# Patient Record
Sex: Female | Born: 1990 | Race: White | Hispanic: No | Marital: Married | Smoking: Never smoker
Health system: Southern US, Community
[De-identification: ages and names within clinical notes are randomized; demographics above are authoritative.]

## PROBLEM LIST (undated history)

## (undated) DIAGNOSIS — Z8742 Personal history of other diseases of the female genital tract: Secondary | ICD-10-CM

## (undated) DIAGNOSIS — D6800 Von Willebrand disease, unspecified: Secondary | ICD-10-CM

## (undated) DIAGNOSIS — E559 Vitamin D deficiency, unspecified: Secondary | ICD-10-CM

## (undated) DIAGNOSIS — D649 Anemia, unspecified: Secondary | ICD-10-CM

## (undated) DIAGNOSIS — K589 Irritable bowel syndrome without diarrhea: Secondary | ICD-10-CM

## (undated) HISTORY — DX: Personal history of other diseases of the female genital tract: Z87.42

## (undated) HISTORY — PX: WISDOM TOOTH EXTRACTION: SHX21

## (undated) HISTORY — DX: Von Willebrand's disease: D68.0

## (undated) HISTORY — DX: Vitamin D deficiency, unspecified: E55.9

## (undated) HISTORY — DX: Von Willebrand disease, unspecified: D68.00

## (undated) HISTORY — DX: Anemia, unspecified: D64.9

## (undated) HISTORY — DX: Irritable bowel syndrome without diarrhea: K58.9

## (undated) HISTORY — PX: OTHER SURGICAL HISTORY: SHX169

---

## 2016-01-04 HISTORY — PX: OTHER SURGICAL HISTORY: SHX169

## 2016-09-19 DIAGNOSIS — Z3482 Encounter for supervision of other normal pregnancy, second trimester: Secondary | ICD-10-CM | POA: Diagnosis not present

## 2016-09-19 DIAGNOSIS — Z3483 Encounter for supervision of other normal pregnancy, third trimester: Secondary | ICD-10-CM | POA: Diagnosis not present

## 2016-09-19 DIAGNOSIS — O99113 Other diseases of the blood and blood-forming organs and certain disorders involving the immune mechanism complicating pregnancy, third trimester: Secondary | ICD-10-CM | POA: Diagnosis not present

## 2016-09-19 DIAGNOSIS — O99013 Anemia complicating pregnancy, third trimester: Secondary | ICD-10-CM | POA: Diagnosis not present

## 2016-09-19 DIAGNOSIS — O09893 Supervision of other high risk pregnancies, third trimester: Secondary | ICD-10-CM | POA: Diagnosis not present

## 2016-09-19 DIAGNOSIS — O0930 Supervision of pregnancy with insufficient antenatal care, unspecified trimester: Secondary | ICD-10-CM | POA: Diagnosis not present

## 2016-09-19 DIAGNOSIS — D509 Iron deficiency anemia, unspecified: Secondary | ICD-10-CM | POA: Diagnosis not present

## 2016-09-19 DIAGNOSIS — Z3689 Encounter for other specified antenatal screening: Secondary | ICD-10-CM | POA: Diagnosis not present

## 2016-09-19 DIAGNOSIS — Z3A36 36 weeks gestation of pregnancy: Secondary | ICD-10-CM | POA: Diagnosis not present

## 2016-09-19 DIAGNOSIS — Z841 Family history of disorders of kidney and ureter: Secondary | ICD-10-CM | POA: Diagnosis not present

## 2016-09-19 DIAGNOSIS — D680 Von Willebrand's disease: Secondary | ICD-10-CM | POA: Diagnosis not present

## 2016-09-26 DIAGNOSIS — O99013 Anemia complicating pregnancy, third trimester: Secondary | ICD-10-CM | POA: Diagnosis not present

## 2016-09-26 DIAGNOSIS — O99113 Other diseases of the blood and blood-forming organs and certain disorders involving the immune mechanism complicating pregnancy, third trimester: Secondary | ICD-10-CM | POA: Diagnosis not present

## 2016-09-26 DIAGNOSIS — O4703 False labor before 37 completed weeks of gestation, third trimester: Secondary | ICD-10-CM | POA: Diagnosis not present

## 2016-09-26 DIAGNOSIS — D680 Von Willebrand's disease: Secondary | ICD-10-CM | POA: Diagnosis not present

## 2016-09-26 DIAGNOSIS — Z3A36 36 weeks gestation of pregnancy: Secondary | ICD-10-CM | POA: Diagnosis not present

## 2016-09-26 DIAGNOSIS — Z862 Personal history of diseases of the blood and blood-forming organs and certain disorders involving the immune mechanism: Secondary | ICD-10-CM | POA: Diagnosis not present

## 2016-09-30 DIAGNOSIS — D680 Von Willebrand's disease: Secondary | ICD-10-CM | POA: Diagnosis not present

## 2016-09-30 DIAGNOSIS — O26893 Other specified pregnancy related conditions, third trimester: Secondary | ICD-10-CM | POA: Diagnosis not present

## 2016-09-30 DIAGNOSIS — O99113 Other diseases of the blood and blood-forming organs and certain disorders involving the immune mechanism complicating pregnancy, third trimester: Secondary | ICD-10-CM | POA: Diagnosis not present

## 2016-09-30 DIAGNOSIS — Z0371 Encounter for suspected problem with amniotic cavity and membrane ruled out: Secondary | ICD-10-CM | POA: Diagnosis not present

## 2016-09-30 DIAGNOSIS — O4292 Full-term premature rupture of membranes, unspecified as to length of time between rupture and onset of labor: Secondary | ICD-10-CM | POA: Diagnosis not present

## 2016-09-30 DIAGNOSIS — Z3A38 38 weeks gestation of pregnancy: Secondary | ICD-10-CM | POA: Diagnosis not present

## 2016-09-30 DIAGNOSIS — Z862 Personal history of diseases of the blood and blood-forming organs and certain disorders involving the immune mechanism: Secondary | ICD-10-CM | POA: Diagnosis not present

## 2016-10-03 DIAGNOSIS — Z8279 Family history of other congenital malformations, deformations and chromosomal abnormalities: Secondary | ICD-10-CM | POA: Diagnosis not present

## 2016-10-03 DIAGNOSIS — Z3A38 38 weeks gestation of pregnancy: Secondary | ICD-10-CM | POA: Diagnosis not present

## 2016-10-03 DIAGNOSIS — O368130 Decreased fetal movements, third trimester, not applicable or unspecified: Secondary | ICD-10-CM | POA: Diagnosis not present

## 2016-10-03 DIAGNOSIS — O99113 Other diseases of the blood and blood-forming organs and certain disorders involving the immune mechanism complicating pregnancy, third trimester: Secondary | ICD-10-CM | POA: Diagnosis not present

## 2016-10-03 DIAGNOSIS — D680 Von Willebrand's disease: Secondary | ICD-10-CM | POA: Diagnosis not present

## 2016-10-09 DIAGNOSIS — D680 Von Willebrand's disease: Secondary | ICD-10-CM | POA: Diagnosis not present

## 2016-10-09 DIAGNOSIS — O9912 Other diseases of the blood and blood-forming organs and certain disorders involving the immune mechanism complicating childbirth: Secondary | ICD-10-CM | POA: Diagnosis not present

## 2016-10-09 DIAGNOSIS — O9962 Diseases of the digestive system complicating childbirth: Secondary | ICD-10-CM | POA: Diagnosis not present

## 2016-10-09 DIAGNOSIS — Z3A39 39 weeks gestation of pregnancy: Secondary | ICD-10-CM | POA: Diagnosis not present

## 2016-10-09 DIAGNOSIS — D509 Iron deficiency anemia, unspecified: Secondary | ICD-10-CM | POA: Diagnosis not present

## 2016-10-09 DIAGNOSIS — Z791 Long term (current) use of non-steroidal anti-inflammatories (NSAID): Secondary | ICD-10-CM | POA: Diagnosis not present

## 2016-10-09 DIAGNOSIS — O9902 Anemia complicating childbirth: Secondary | ICD-10-CM | POA: Diagnosis not present

## 2016-10-09 DIAGNOSIS — Z302 Encounter for sterilization: Secondary | ICD-10-CM | POA: Diagnosis not present

## 2016-10-09 DIAGNOSIS — Z886 Allergy status to analgesic agent status: Secondary | ICD-10-CM | POA: Diagnosis not present

## 2016-10-09 DIAGNOSIS — Z79899 Other long term (current) drug therapy: Secondary | ICD-10-CM | POA: Diagnosis not present

## 2016-10-09 DIAGNOSIS — K580 Irritable bowel syndrome with diarrhea: Secondary | ICD-10-CM | POA: Diagnosis not present

## 2016-11-22 DIAGNOSIS — O9089 Other complications of the puerperium, not elsewhere classified: Secondary | ICD-10-CM | POA: Diagnosis not present

## 2016-11-22 DIAGNOSIS — R1031 Right lower quadrant pain: Secondary | ICD-10-CM | POA: Diagnosis not present

## 2016-12-16 DIAGNOSIS — N921 Excessive and frequent menstruation with irregular cycle: Secondary | ICD-10-CM | POA: Diagnosis not present

## 2016-12-16 DIAGNOSIS — N939 Abnormal uterine and vaginal bleeding, unspecified: Secondary | ICD-10-CM | POA: Diagnosis not present

## 2017-12-22 DIAGNOSIS — J111 Influenza due to unidentified influenza virus with other respiratory manifestations: Secondary | ICD-10-CM | POA: Diagnosis not present

## 2018-05-22 DIAGNOSIS — Z20828 Contact with and (suspected) exposure to other viral communicable diseases: Secondary | ICD-10-CM | POA: Diagnosis not present

## 2018-05-22 DIAGNOSIS — R6883 Chills (without fever): Secondary | ICD-10-CM | POA: Diagnosis not present

## 2018-05-22 DIAGNOSIS — J029 Acute pharyngitis, unspecified: Secondary | ICD-10-CM | POA: Diagnosis not present

## 2018-07-26 ENCOUNTER — Other Ambulatory Visit: Payer: Self-pay

## 2018-07-26 ENCOUNTER — Ambulatory Visit (INDEPENDENT_AMBULATORY_CARE_PROVIDER_SITE_OTHER): Payer: Self-pay | Admitting: Family Medicine

## 2018-07-26 DIAGNOSIS — N946 Dysmenorrhea, unspecified: Secondary | ICD-10-CM

## 2018-07-26 DIAGNOSIS — Z8719 Personal history of other diseases of the digestive system: Secondary | ICD-10-CM

## 2018-07-26 DIAGNOSIS — R110 Nausea: Secondary | ICD-10-CM

## 2018-07-26 MED ORDER — ONDANSETRON HCL 4 MG PO TABS: 4.0000 mg | ORAL_TABLET | Freq: Three times a day (TID) | ORAL | 0 refills | Status: DC | PRN

## 2018-07-26 NOTE — Progress Notes (Signed)
Virtual Visit via Telephone Note  I connected with Jordan Santos on 07/26/18 at  4:00 PM EDT by telephone and verified that I am speaking with the correct person using two identifiers.   I discussed the limitations, risks, security and privacy concerns of performing an evaluation and management service by telephone and the availability of in person appointments. I also discussed with the patient that there may be a patient responsible charge related to this service. The patient expressed understanding and agreed to proceed.  Location patient: home Location provider: work or home office Participants present for the call: patient, provider Patient did not have a visit in the prior 7 days to address this/these issue(s).   History of Present Illness:  Acute visit for Nausea: -she has dysmenorrhea, had ablation for DUB/heavy bleeding and tubes tied for this -she has nausea and cramping around the time of her periods -FDLMP: 2.5 weeks, has had bad cramping, nausea and joint pains with  periods - this period had the same, but still nauseous even though the period has resolved -she called her ob - they could not get her in until September -no vomiting or diarrhea -has IBS - mixed, pretty regular currently -has taken bentyl in the past for her IBS -no cough, congestion, sinus issues recently -has taken zofran and phenergan in the past for her nausea -both parents had COVID19 in May - She was sick after seeing them with presumed COVID and then felt better -works in office, 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 I do not appreciate any SOB. Speech and thought processing are grossly intact. Patient reported vitals:  Assessment and Plan:  Nausea Dysmenorrhea IBS history  -we discussed at length possible serious  and likely etiologies, workup and treatment, treatment risks and return precautions. Query IBS, dysmenorrhea, functional, mild gastritis vs other. -after this discussion, Jordan Santos opted for trial of PPI and prn antiemetic with close follow up  -follow up advised as scheduled with new PCP 08/03/2018 -of course, we advised Jordan Santos  to return or notify a doctor immediately if symptoms worsen or persist or new concerns arise.  Follow Up Instructions:   I did not refer this patient for an OV in the next 24 hours for this/these issue(s).  I discussed the assessment and treatment plan with the patient. The patient was provided an opportunity to ask questions and all were answered. The patient agreed with the plan and demonstrated an understanding of the instructions.    I provided 15 minutes of non-face-to-face time during this encounter.   Lucretia Kern, DO

## 2018-08-02 ENCOUNTER — Encounter: Payer: Self-pay | Admitting: Internal Medicine

## 2018-08-02 ENCOUNTER — Other Ambulatory Visit: Payer: Self-pay

## 2018-08-02 ENCOUNTER — Ambulatory Visit (INDEPENDENT_AMBULATORY_CARE_PROVIDER_SITE_OTHER): Payer: BC Managed Care – PPO | Admitting: Internal Medicine

## 2018-08-02 VITALS — BP 102/64 | HR 90 | Temp 98.0°F | Ht 63.5 in | Wt 131.9 lb

## 2018-08-02 DIAGNOSIS — R5383 Other fatigue: Secondary | ICD-10-CM | POA: Diagnosis not present

## 2018-08-02 DIAGNOSIS — Z8742 Personal history of other diseases of the female genital tract: Secondary | ICD-10-CM | POA: Insufficient documentation

## 2018-08-02 DIAGNOSIS — D6800 Von Willebrand disease, unspecified: Secondary | ICD-10-CM | POA: Insufficient documentation

## 2018-08-02 DIAGNOSIS — R110 Nausea: Secondary | ICD-10-CM | POA: Diagnosis not present

## 2018-08-02 DIAGNOSIS — R1030 Lower abdominal pain, unspecified: Secondary | ICD-10-CM

## 2018-08-02 DIAGNOSIS — N946 Dysmenorrhea, unspecified: Secondary | ICD-10-CM

## 2018-08-02 LAB — CBC WITH DIFFERENTIAL/PLATELET
Basophils Absolute: 0 10*3/uL (ref 0.0–0.1)
Basophils Relative: 0.3 % (ref 0.0–3.0)
Eosinophils Absolute: 0 10*3/uL (ref 0.0–0.7)
Eosinophils Relative: 1.1 % (ref 0.0–5.0)
HCT: 38.1 % (ref 36.0–46.0)
Hemoglobin: 12.9 g/dL (ref 12.0–15.0)
Lymphocytes Relative: 33.7 % (ref 12.0–46.0)
Lymphs Abs: 1.3 10*3/uL (ref 0.7–4.0)
MCHC: 33.9 g/dL (ref 30.0–36.0)
MCV: 88.3 fl (ref 78.0–100.0)
Monocytes Absolute: 0.3 10*3/uL (ref 0.1–1.0)
Monocytes Relative: 8.6 % (ref 3.0–12.0)
Neutro Abs: 2.1 10*3/uL (ref 1.4–7.7)
Neutrophils Relative %: 56.3 % (ref 43.0–77.0)
Platelets: 262 10*3/uL (ref 150.0–400.0)
RBC: 4.31 Mil/uL (ref 3.87–5.11)
RDW: 12.6 % (ref 11.5–15.5)
WBC: 3.8 10*3/uL — ABNORMAL LOW (ref 4.0–10.5)

## 2018-08-02 LAB — COMPREHENSIVE METABOLIC PANEL
ALT: 8 U/L (ref 0–35)
AST: 14 U/L (ref 0–37)
Albumin: 4.4 g/dL (ref 3.5–5.2)
Alkaline Phosphatase: 42 U/L (ref 39–117)
BUN: 7 mg/dL (ref 6–23)
CO2: 28 mEq/L (ref 19–32)
Calcium: 9.5 mg/dL (ref 8.4–10.5)
Chloride: 104 mEq/L (ref 96–112)
Creatinine, Ser: 0.74 mg/dL (ref 0.40–1.20)
GFR: 93.28 mL/min (ref >60.00)
Glucose, Bld: 79 mg/dL (ref 70–99)
Potassium: 3.8 mEq/L (ref 3.5–5.1)
Sodium: 139 mEq/L (ref 135–145)
Total Bilirubin: 0.7 mg/dL (ref 0.2–1.2)
Total Protein: 7.1 g/dL (ref 6.0–8.3)

## 2018-08-02 LAB — POCT URINALYSIS DIPSTICK
Bilirubin, UA: NEGATIVE
Blood, UA: NEGATIVE
Glucose, UA: NEGATIVE
Ketones, UA: NEGATIVE
Leukocytes, UA: NEGATIVE
Nitrite, UA: NEGATIVE
Protein, UA: NEGATIVE
Spec Grav, UA: 1.015 (ref 1.010–1.025)
Urobilinogen, UA: 0.2 E.U./dL
pH, UA: 6 (ref 5.0–8.0)

## 2018-08-02 LAB — TSH: TSH: 0.67 u[IU]/mL (ref 0.35–4.50)

## 2018-08-02 LAB — VITAMIN D 25 HYDROXY (VIT D DEFICIENCY, FRACTURES): VITD: 21.29 ng/mL — ABNORMAL LOW (ref 30.00–100.00)

## 2018-08-02 LAB — POCT URINE PREGNANCY: Preg Test, Ur: NEGATIVE

## 2018-08-02 LAB — VITAMIN B12: Vitamin B-12: 354 pg/mL (ref 211–911)

## 2018-08-02 NOTE — Patient Instructions (Signed)
-  Nice meeting you today!!  -Lab work today; will notify you once results are available. 

## 2018-08-02 NOTE — Progress Notes (Signed)
Established Patient Office Visit     CC/Reason for Visit: Nausea, cramping  HPI: Jordan Santos is a 28 y.o. female who is coming in today for the above mentioned reasons. Past Medical History is significant for: Von Willebrand's disease, dysmenorrhea and heavy periods that culminated in her having a uterine ablation in 2018.  She has 3 children and has had a tubal ligation.  Her GYN is Dr. Posey Pronto in [facility].  She states her last menstrual period was 3 weeks ago, since then has been feeling weak, dizzy, tired, fatigued the main issue is nausea for which she was seen in the office last week and prescribed Nexium and Zofran which has not given her significant relief.  She has no fever, no urinary symptoms.   Past Medical/Surgical History: Past Medical History:  Diagnosis Date  . History of heavy periods   . Von Willebrand disease ([place])     Past Surgical History:  Procedure Laterality Date  . tubal ligation  2018    Social History:  reports that she has never smoked. She has never used smokeless tobacco. No history on file for alcohol and drug.  Allergies: Allergies  Allergen Reactions  . Aspirin Other (See Comments)    Due to Von Willebrand's disease Cannot take due to bleeding disorder   . Ibuprofen Other (See Comments)    Cannot take due to bleeding disorder  . Nsaids Other (See Comments)    Pt cannot take due to blood disorder  . Other     Per patient she cannot take aspirin or Advil due to blood disorder    Family History:  No history of heart disease, cancer, stroke that she is aware of   Current Outpatient Medications:  .  esomeprazole (NEXIUM) 20 MG capsule, Take 20 mg by mouth daily at 12 noon., Disp: , Rfl:  .  ondansetron (ZOFRAN) 4 MG tablet, Take 1 tablet (4 mg total) by mouth every 8 (eight) hours as needed for nausea or vomiting., Disp: 20 tablet, Rfl: 0  Review of Systems:  Constitutional: Denies fever, chills, diaphoresis, appetite change.  HEENT: Denies photophobia, eye pain, redness, hearing loss, ear pain, congestion, sore throat, rhinorrhea, sneezing, mouth sores, trouble swallowing, neck pain, neck stiffness and tinnitus.   Respiratory: Denies SOB, DOE, cough, chest tightness,  and wheezing.   Cardiovascular: Denies chest pain, palpitations and leg swelling.  Gastrointestinal: Denies  diarrhea, constipation, blood in stool and abdominal distention.  Genitourinary: Denies dysuria, urgency, frequency, hematuria, flank pain and difficulty urinating.  Endocrine: Denies: hot or cold intolerance, sweats, changes in hair or nails, polyuria, polydipsia. Musculoskeletal: Denies myalgias, back pain, joint swelling, arthralgias and gait problem.  Skin: Denies pallor, rash and wound.  Neurological: Denies dizziness, seizures, syncope, numbness and headaches.  Hematological: Denies adenopathy. Easy bruising, personal or family bleeding history  Psychiatric/Behavioral: Denies suicidal ideation, mood changes, confusion, nervousness, sleep disturbance and agitation    Physical Exam: Vitals:   08/02/18 0910  BP: 102/64  Pulse: 90  Temp: 98 F (36.7 C)  TempSrc: Oral  SpO2: 96%  Weight: 131 lb 14.4 oz (59.8 kg)  Height: 5' 3.5" (1.613 m)    Body mass index is 23 kg/m.   Constitutional: NAD, calm, comfortable Eyes: PERRL, lids and conjunctivae normal ENMT: Mucous membranes are moist.  Neck: normal, supple, no masses, positive neck fullness Respiratory: clear to auscultation bilaterally, no wheezing, no crackles. Normal respiratory effort. No accessory muscle use.  Cardiovascular: Regular rate and rhythm,  no murmurs / rubs / gallops. No extremity edema. 2+ pedal pulses. No carotid bruits.  Abdomen: no tenderness, no masses palpated. No hepatosplenomegaly. Bowel sounds positive.  Musculoskeletal: no clubbing / cyanosis. No joint deformity upper and lower extremities. Good ROM, no contractures. Normal muscle tone.  Neurologic:  Grossly intact and nonfocal Psychiatric: Normal judgment and insight. Alert and oriented x 3. Normal mood.    Impression and Plan:  Lower abdominal pain  Nausea  Dysmenorrhea History of heavy periods Fatigue, unspecified type   -I suspect all of these are connected. -Urine pregnancy and urine dipstick are negative today. -She wonders about ectopic pregnancy, however I have advised that with a tubal ligation and a negative in office pregnancy test it is highly unlikely.  To be extra sure I have told her that she may take a home pregnancy test in another week. -Will check blood work today including CBC, CMP, TSH, vitamin B12 and vitamin D to try and identify cause of fatigue and abdominal pain. -Do not believe that her issues represent GERD, however have advised that she continue Nexium for about 8 weeks to see if any improvement. -Further work-up pending above results. -She has also been advised to follow-up with her GYN.  Von Willebrand disease ([facility])  -Diagnosed in childhood, noted.     Patient Instructions  -Nice meeting you today!!  -Lab work today; will notify you once results are available.     Chaya JanEstela Hernandez Acosta, MD [facility]

## 2018-08-03 ENCOUNTER — Encounter: Payer: Self-pay | Admitting: Internal Medicine

## 2018-08-03 ENCOUNTER — Other Ambulatory Visit (INDEPENDENT_AMBULATORY_CARE_PROVIDER_SITE_OTHER): Payer: BC Managed Care – PPO

## 2018-08-03 ENCOUNTER — Telehealth: Payer: Self-pay | Admitting: Internal Medicine

## 2018-08-03 ENCOUNTER — Ambulatory Visit: Payer: Self-pay | Admitting: Internal Medicine

## 2018-08-03 ENCOUNTER — Other Ambulatory Visit: Payer: Self-pay | Admitting: Internal Medicine

## 2018-08-03 DIAGNOSIS — E559 Vitamin D deficiency, unspecified: Secondary | ICD-10-CM

## 2018-08-03 DIAGNOSIS — Z8742 Personal history of other diseases of the female genital tract: Secondary | ICD-10-CM | POA: Diagnosis not present

## 2018-08-03 DIAGNOSIS — R899 Unspecified abnormal finding in specimens from other organs, systems and tissues: Secondary | ICD-10-CM

## 2018-08-03 LAB — T4, FREE: Free T4: 0.9 ng/dL (ref 0.60–1.60)

## 2018-08-03 LAB — T3, FREE: T3, Free: 3.1 pg/mL (ref 2.3–4.2)

## 2018-08-03 MED ORDER — VITAMIN D (ERGOCALCIFEROL) 1.25 MG (50000 UNIT) PO CAPS: 50000.0000 [IU] | ORAL_CAPSULE | ORAL | 0 refills | Status: AC

## 2018-08-03 NOTE — Telephone Encounter (Signed)
The patient called wanting to know her lab results. I explained to her about MyChart and sent her the link to set up her MyChart.  Please Advise

## 2018-08-07 ENCOUNTER — Other Ambulatory Visit: Payer: Self-pay | Admitting: Internal Medicine

## 2018-08-07 DIAGNOSIS — R21 Rash and other nonspecific skin eruption: Secondary | ICD-10-CM

## 2018-08-07 DIAGNOSIS — E559 Vitamin D deficiency, unspecified: Secondary | ICD-10-CM

## 2018-08-07 DIAGNOSIS — R7989 Other specified abnormal findings of blood chemistry: Secondary | ICD-10-CM

## 2018-08-07 NOTE — Telephone Encounter (Signed)
Patient is aware and released to MyChart

## 2018-08-09 ENCOUNTER — Other Ambulatory Visit: Payer: Self-pay

## 2018-08-09 ENCOUNTER — Other Ambulatory Visit (INDEPENDENT_AMBULATORY_CARE_PROVIDER_SITE_OTHER): Payer: BC Managed Care – PPO

## 2018-08-09 DIAGNOSIS — E559 Vitamin D deficiency, unspecified: Secondary | ICD-10-CM | POA: Diagnosis not present

## 2018-08-09 DIAGNOSIS — R7989 Other specified abnormal findings of blood chemistry: Secondary | ICD-10-CM

## 2018-08-09 DIAGNOSIS — R21 Rash and other nonspecific skin eruption: Secondary | ICD-10-CM

## 2018-08-10 LAB — VITAMIN D 25 HYDROXY (VIT D DEFICIENCY, FRACTURES): VITD: 28.02 ng/mL — ABNORMAL LOW (ref 30.00–100.00)

## 2018-08-10 LAB — TSH: TSH: 0.9 u[IU]/mL (ref 0.35–4.50)

## 2018-08-11 LAB — ANTI-NUCLEAR AB-TITER (ANA TITER)
ANA TITER: 1:320 {titer} — ABNORMAL HIGH
ANA Titer 1: 1:320 {titer} — ABNORMAL HIGH

## 2018-08-11 LAB — ANA: Anti Nuclear Antibody (ANA): POSITIVE — AB

## 2018-08-17 DIAGNOSIS — R768 Other specified abnormal immunological findings in serum: Secondary | ICD-10-CM | POA: Diagnosis not present

## 2018-08-17 DIAGNOSIS — R5382 Chronic fatigue, unspecified: Secondary | ICD-10-CM | POA: Diagnosis not present

## 2018-08-28 ENCOUNTER — Encounter: Payer: Self-pay | Admitting: Internal Medicine

## 2018-08-29 ENCOUNTER — Encounter: Payer: Self-pay | Admitting: Gastroenterology

## 2018-08-29 ENCOUNTER — Other Ambulatory Visit: Payer: Self-pay | Admitting: Internal Medicine

## 2018-08-29 DIAGNOSIS — R197 Diarrhea, unspecified: Secondary | ICD-10-CM

## 2018-09-03 ENCOUNTER — Other Ambulatory Visit: Payer: Self-pay

## 2018-09-03 ENCOUNTER — Other Ambulatory Visit (INDEPENDENT_AMBULATORY_CARE_PROVIDER_SITE_OTHER): Payer: BC Managed Care – PPO

## 2018-09-03 ENCOUNTER — Encounter: Payer: Self-pay | Admitting: Gastroenterology

## 2018-09-03 ENCOUNTER — Ambulatory Visit (INDEPENDENT_AMBULATORY_CARE_PROVIDER_SITE_OTHER): Payer: BC Managed Care – PPO | Admitting: Gastroenterology

## 2018-09-03 VITALS — BP 118/66 | HR 76 | Temp 97.3°F | Ht 63.5 in | Wt 134.4 lb

## 2018-09-03 DIAGNOSIS — R110 Nausea: Secondary | ICD-10-CM | POA: Diagnosis not present

## 2018-09-03 DIAGNOSIS — K582 Mixed irritable bowel syndrome: Secondary | ICD-10-CM

## 2018-09-03 LAB — IGA: IgA: 152 mg/dL (ref 68–378)

## 2018-09-03 NOTE — Patient Instructions (Addendum)
You have been scheduled for an abdominal ultrasound at [facility] (1st floor of hospital) on 09/06/2018 at 10:30am. Please arrive 15 minutes prior to your appointment for registration. Make certain not to have anything to eat or drink 6 hours prior to your appointment. Should you need to reschedule your appointment, please contact radiology at [phone]. This test typically takes about 30 minutes to perform.  You have been scheduled for an endoscopy. Please follow written instructions given to you at your visit today. If you use inhalers (even only as needed), please bring them with you on the day of your procedure.   Go to the basement for labs today  FD Gard 1 capsule three times a day as needed  You will need Vitamin b12 1031mcg sl daily for 4 weeks  I appreciate the  opportunity to care for you  Thank You   Harl Bowie , MD

## 2018-09-03 NOTE — Progress Notes (Signed)
Jordan Santos    [id number]    [birth year]  Primary Care Physician:Jordan Priscella MannAcosta, Jordan Y, MD  Referring Physician: Philip AspenHernandez Acosta, Limmie PatriciaEstela Y, MD [address] [place],  [place] [id number]   Chief complaint: Nausea  HPI: 28 year old female with history of von Willebrand disease here with complaints of nausea for the past 6 weeks Gets intermittent nausea around her menstrual cycle but in the past 6 weeks she is having constant nausea daily and is present throughout the day.  She has decreased appetite.  Denies any vomiting, dysphagia, odynophagia. She has intermittent constipation and diarrhea but denies any blood in stool, melena or bright red blood per rectum, was diagnosed with irritable bowel syndrome. She has intermittent lower abdominal cramping and right side pain, worse when she presses on it or lays on that side. No improvement with Nexium.  Zofran helps to some extent with nausea. No family history of IBD, celiac disease or GI malignancy.  She is status post uterine ablation for excessive menstrual bleeding and also had tubal ligation.  Outpatient Encounter Medications as of 09/03/2018  Medication Sig  . esomeprazole (NEXIUM) 20 MG capsule Take 20 mg by mouth daily at 12 noon.  . ondansetron (ZOFRAN) 4 MG tablet Take 1 tablet (4 mg total) by mouth every 8 (eight) hours as needed for nausea or vomiting.  . Vitamin D, Ergocalciferol, (DRISDOL) 1.25 MG (50000 UT) CAPS capsule Take 1 capsule (50,000 Units total) by mouth every 7 (seven) days for 12 doses. (Patient taking differently: Take 50,000 Units by mouth every 7 (seven) days. (on dose 7 of this medicaiton as of 09/03/18))   No facility-administered encounter medications on file as of 09/03/2018.     Allergies as of 09/03/2018 - Review Complete 09/03/2018  Allergen Reaction Noted  . Aspirin Other (See Comments) 10/11/2013  . Ibuprofen Other (See Comments) 03/09/2016  . Nsaids Other (See Comments)  11/22/2016  . Other  07/26/2018    Past Medical History:  Diagnosis Date  . History of heavy periods   . Von Willebrand disease ([facility])     Past Surgical History:  Procedure Laterality Date  . tubal ligation  2018    No family history on file.  Social History   Socioeconomic History  . Marital status: Married    Spouse name: Not on file  . Number of children: Not on file  . Years of education: Not on file  . Highest education level: Not on file  Occupational History  . Not on file  Social Needs  . Financial resource strain: Not on file  . Food insecurity    Worry: Not on file    Inability: Not on file  . Transportation needs    Medical: Not on file    Non-medical: Not on file  Tobacco Use  . Smoking status: Never Smoker  . Smokeless tobacco: Never Used  Substance and Sexual Activity  . Alcohol use: Not on file  . Drug use: Not on file  . Sexual activity: Not on file  Lifestyle  . Physical activity    Days per week: Not on file    Minutes per session: Not on file  . Stress: Not on file  Relationships  . Social Musicianconnections    Talks on phone: Not on file    Gets together: Not on file    Attends religious service: Not on file    Active member of club or organization:  Not on file    Attends meetings of clubs or organizations: Not on file    Relationship status: Not on file  . Intimate partner violence    Fear of current or ex partner: Not on file    Emotionally abused: Not on file    Physically abused: Not on file    Forced sexual activity: Not on file  Other Topics Concern  . Not on file  Social History Narrative  . Not on file      Review of systems: Review of Systems  Constitutional: Negative for fever and chills.  HENT: Negative.   Eyes: Negative for blurred vision.  Respiratory: Negative for cough, shortness of breath and wheezing.   Cardiovascular: Negative for chest pain and palpitations.  Gastrointestinal: as per HPI Genitourinary: Negative  for dysuria, urgency, frequency and hematuria.  Musculoskeletal: Negative for myalgias, back pain and joint pain.  Skin: Negative for itching and rash.  Neurological: Negative for dizziness, tremors, focal weakness, seizures and loss of consciousness.  Endo/Heme/Allergies: Positive for seasonal allergies.  Positive for von Willebrand's disease Psychiatric/Behavioral: Negative for depression, suicidal ideas and hallucinations.  All other systems reviewed and are negative.   Physical Exam: Vitals:   09/03/18 1405  Temp: (!) 97.3 F (36.3 C)   Body mass index is 23.43 kg/m. Gen:      No acute distress HEENT:  EOMI, sclera anicteric Neck:     No masses; no thyromegaly Lungs:    Clear to auscultation bilaterally; normal respiratory effort CV:         Regular rate and rhythm; no murmurs Abd:      + bowel sounds; soft, non-tender; no palpable masses, no distension Ext:    No edema; adequate peripheral perfusion Skin:      Warm and dry; no rash Neuro: alert and oriented x 3 Psych: normal mood and affect  Data Reviewed:  Reviewed labs, radiology imaging, old records and pertinent past GI work up   Assessment and Plan/Recommendations:  28 year old female with history of von Willebrand disease and irritable bowel syndrome with alternating constipation diarrhea with complaints of nausea for past 6 weeks Unclear etiology for nausea Continue Nexium 20 mg daily for possible GERD associated nausea.  If no improvement despite use of PPI for 6 to 8 weeks, will discontinue it. Will obtain abdominal ultrasound to exclude gallbladder disease TTG IgA antibody and IgA level to exclude celiac disease Check serum beta-hCG to exclude tubal pregnancy If above work-up unrevealing, will plan for EGD to exclude esophagitis/gastritis We will send referral to hematology for possible DDAVP prior to procedure, low risk for bleeding with diagnostic EGD. The risks and benefits as well as alternatives of  endoscopic procedure(s) have been discussed and reviewed. All questions answered. The patient agrees to proceed.   Jordan Santos , MD    CC: Jordan Santos, [place]*

## 2018-09-04 LAB — TISSUE TRANSGLUTAMINASE, IGA: (tTG) Ab, IgA: 1 U/mL

## 2018-09-04 LAB — HCG, SERUM, QUALITATIVE: Preg, Serum: NEGATIVE

## 2018-09-06 ENCOUNTER — Other Ambulatory Visit: Payer: Self-pay

## 2018-09-06 ENCOUNTER — Telehealth: Payer: Self-pay | Admitting: *Deleted

## 2018-09-06 ENCOUNTER — Ambulatory Visit (HOSPITAL_COMMUNITY)
Admission: RE | Admit: 2018-09-06 | Discharge: 2018-09-06 | Disposition: A | Discharge status: Home / Self Care | Payer: BC Managed Care – PPO | Source: Ambulatory Visit | Attending: Gastroenterology | Admitting: Gastroenterology

## 2018-09-06 DIAGNOSIS — R110 Nausea: Secondary | ICD-10-CM | POA: Diagnosis not present

## 2018-09-06 DIAGNOSIS — Z148 Genetic carrier of other disease: Secondary | ICD-10-CM

## 2018-09-06 NOTE — Telephone Encounter (Signed)
===  View-only below this line=== ----- Message ----- From: Mauri Pole, MD Sent: 09/05/2018   2:05 PM EDT To: Oda Kilts, CMA Subject: RE: EGD                                        Marcela Alatorre please send referral to hematology to evaluate if she will need DDAVP infusion prior to procedure.  Plan for diagnostic EGD, is low risk for bleeding.  Thank you ----- Message ----- From: Oda Kilts, CMA Sent: 09/05/2018   1:00 PM EDT To: Mauri Pole, MD Subject: RE: EGD                                         ----- Message ----- From: Osvaldo Angst, CRNA Sent: 09/04/2018  12:07 PM EDT To: Oda Kilts, CMA Subject: RE: EGD                                        Shirlean Mylar,  This pt is cleared for anesthetic care at [facility].   Thanks,  Osvaldo Angst ----- Message ----- From: Oda Kilts, CMA Sent: 09/03/2018   3:39 PM EDT To: Osvaldo Angst, CRNA Subject: EGD                                            Will you check out this patient to see if she can be done in the [facility] EGD IS SCHEDULED FOR 9/11 DO WE NEED TO RESCHEDULE?

## 2018-09-06 NOTE — Telephone Encounter (Signed)
Await abd ultrasound results. Please check if she is scheduled for an appointment by hematology by next week Tuesday, if unable to get her in before procedure, will need to reschedule. Thanks

## 2018-09-07 ENCOUNTER — Encounter: Payer: Self-pay | Admitting: Gastroenterology

## 2018-09-11 NOTE — Telephone Encounter (Signed)
Patients referral was authorized but has not been scheduled yet. Do you want me to cancel the EGD

## 2018-09-12 NOTE — Telephone Encounter (Signed)
Yes, please reschedule the appointment. Thanks

## 2018-09-12 NOTE — Telephone Encounter (Signed)
Patient states she doesn't want to have the EGD after all she doesn't think her problems are upper intestinal I told her to contact the office if she wants to reschedule her EGD

## 2018-09-12 NOTE — Telephone Encounter (Signed)
ok 

## 2018-09-14 ENCOUNTER — Encounter: Payer: BC Managed Care – PPO | Admitting: Gastroenterology

## 2018-09-19 ENCOUNTER — Telehealth: Payer: Self-pay | Admitting: Hematology & Oncology

## 2018-09-19 NOTE — Telephone Encounter (Signed)
Called patient to schedule appointment based on referral from  GI.  Patient stated that this appointment was no longer needed.  I sent IB message to referring MD advising him of this also. Appointments have been cancelled at this time.  I will await response back from referring MD

## 2018-10-05 ENCOUNTER — Other Ambulatory Visit: Payer: BC Managed Care – PPO

## 2018-10-05 ENCOUNTER — Ambulatory Visit: Payer: BC Managed Care – PPO | Admitting: Hematology & Oncology

## 2018-10-17 ENCOUNTER — Ambulatory Visit: Payer: BC Managed Care – PPO | Admitting: Gastroenterology

## 2019-02-05 ENCOUNTER — Encounter: Payer: BC Managed Care – PPO | Admitting: Internal Medicine

## 2019-08-21 ENCOUNTER — Encounter: Payer: Self-pay | Admitting: Internal Medicine

## 2019-09-19 DIAGNOSIS — Z23 Encounter for immunization: Secondary | ICD-10-CM | POA: Diagnosis not present

## 2019-09-25 DIAGNOSIS — Z20822 Contact with and (suspected) exposure to covid-19: Secondary | ICD-10-CM | POA: Diagnosis not present

## 2020-06-25 ENCOUNTER — Ambulatory Visit: Payer: BC Managed Care – PPO | Admitting: Internal Medicine

## 2020-06-25 DIAGNOSIS — Z0289 Encounter for other administrative examinations: Secondary | ICD-10-CM

## 2020-06-29 ENCOUNTER — Encounter: Payer: Self-pay | Admitting: Family Medicine

## 2020-06-29 ENCOUNTER — Other Ambulatory Visit: Payer: Self-pay | Admitting: Family Medicine

## 2020-06-29 ENCOUNTER — Ambulatory Visit: Payer: BC Managed Care – PPO | Admitting: Family Medicine

## 2020-06-29 ENCOUNTER — Ambulatory Visit (INDEPENDENT_AMBULATORY_CARE_PROVIDER_SITE_OTHER): Payer: BC Managed Care – PPO | Admitting: Family Medicine

## 2020-06-29 ENCOUNTER — Other Ambulatory Visit: Payer: Self-pay

## 2020-06-29 VITALS — BP 110/70 | HR 87 | Resp 12 | Ht 63.5 in | Wt 141.0 lb

## 2020-06-29 DIAGNOSIS — N644 Mastodynia: Secondary | ICD-10-CM

## 2020-06-29 DIAGNOSIS — N6019 Diffuse cystic mastopathy of unspecified breast: Secondary | ICD-10-CM

## 2020-06-29 DIAGNOSIS — L821 Other seborrheic keratosis: Secondary | ICD-10-CM

## 2020-06-29 NOTE — Patient Instructions (Addendum)
A few things to remember from today's visit:   Breast tenderness in female - Plan: MM DIAG BREAST TOMO BILATERAL  Seborrheic keratosis  Fibrocystic breast disease (FCBD), unspecified laterality  Continue monitoring skin lesions.  Do not use My Chart to request refills or for acute issues that need immediate attention.  Fibrocystic Breast Changes  Fibrocystic breast changes are changes that can make your breasts swollen or painful. These changes happen when there is scar-like tissue (fibrous tissue) in the breasts or tiny sacs of fluid (cysts) form in the breast. This is a common condition. It does not mean that you have cancer. It usually happens because of hormone changes during a monthlyperiod. What are the causes? The exact cause of this condition is not known. However, you are more likely to have it: If you have high levels of female hormones. If your mother had the same condition (inherited). What are the signs or symptoms? Symptoms of this condition include: Tenderness, swelling, mild discomfort, or pain. Rope-like tissue that can be felt when touching the breast. Lumps in one or both breasts. Changes in breast size. Breasts may get larger before your period and smaller after your period. Discharge from the nipple. How is this treated? In many cases, there is no treatment for this condition. In some cases, you may need treatment, including: Taking medicines. Avoiding caffeine. Reducing the amount of sugar and fat in your diet. You may also have: A procedure to remove fluid from a cyst. Surgery to remove a cyst that is large or tender or does not go away. Medicines that may lower female hormones in the body. Follow these instructions at home: Self care Check your breasts after every monthly period. If you do not have monthly periods, check your breasts on the first day of every month. Check for: Soreness. New swelling or puffiness. A change in breast size. A change in a  lump that was already there. General instructions Take over-the-counter and prescription medicines only as told by your doctor. Wear a support or sports bra that fits well. Wear this support especially when you are exercising. If told by your doctor, avoid or have less caffeine, fat, and sugar in what you eat and drink. Keep all follow-up visits as told by your doctor. This is important. Contact a doctor if: You have fluid coming from your nipple, especially if the fluid has blood in it. You have new lumps or bumps in your breast. Your breast gets puffy, red, and painful. You have changes in how your breast looks. Your nipple looks flat or it sinks into your breast. Get help right away if: Your breast turns red, and the redness is spreading. Summary Fibrocystic breast changes are changes that can make your breasts swollen or painful. This condition can happen when you have hormone changes during your monthly period. Check your breasts after every monthly period. If you do not have monthly periods, check your breasts on the first day of every month. This information is not intended to replace advice given to you by your health care provider. Make sure you discuss any questions you have with your healthcare provider. Document Revised: 12/03/2018 Document Reviewed: 12/03/2018 Elsevier Patient Education  2022 [employer]   Please be sure medication list is accurate. If a new problem present, please set up appointment sooner than planned today.

## 2020-06-29 NOTE — Progress Notes (Signed)
ACUTE VISIT Chief Complaint  Patient presents with   sore heavy breasts    X a few months, thought it was stress or hormones, but has been getting worse.   Nevus    On back, different than normal mole. Scaly & not round, has rough edges. Been there for about 3 months.    HPI: [name] is a 30 y.o. female with history of vitamin D deficiency and von Willebrand disease here today complaining of breast tenderness as described above. She has had bilateral breast tenderness for a few months intermittently but for the past 3 weeks she has had constant pain in right breast. No history of trauma. Pain is exacerbated by palpation. Dull achy pain, sometimes radiated to right axilla, 5/10. Negative for numbness or tingling.  "Random" nonproductive cough, attributed to allergies, no associated dyspnea or wheezing. Negative for fever, abnormal weight loss, night sweats, skin rash, breast masses, or nipple discharge. M: 12 G: 4 L: 3 She breast-fed her last child, 3 years ago. LMP 05/29/2020. S/p BLT.  Her mother recently diagnosed with metastatic melanoma in breast. She has noted a new skin lesion on her back, she would like to have it checked. Negative for tenderness or pruritus.  Review of Systems  Constitutional:  Negative for activity change, appetite change, chills, fever and unexpected weight change.  HENT:  Negative for nosebleeds and sore throat.   Cardiovascular:  Negative for chest pain and palpitations.  Gastrointestinal:  Negative for abdominal pain, nausea and vomiting.  Musculoskeletal:  Negative for back pain and myalgias.  Neurological:  Negative for syncope, weakness and numbness.  Rest see pertinent positives and negatives per HPI.  No current outpatient medications on file prior to visit.   No current facility-administered medications on file prior to visit.   Past Medical History:  Diagnosis Date   Anemia    History of heavy periods    Irritable bowel  syndrome    Vitamin D deficiency    Von Willebrand disease (HCC)    Allergies  Allergen Reactions   Aspirin Other (See Comments)    Due to Von Willebrand's disease Cannot take due to bleeding disorder    Ibuprofen Other (See Comments)    Cannot take due to bleeding disorder   Nsaids Other (See Comments)    Pt cannot take due to blood disorder   Other     Per patient she cannot take aspirin or Advil due to blood disorder   Social History   Socioeconomic History   Marital status: Married    Spouse name: Not on file   Number of children: Not on file   Years of education: Not on file   Highest education level: Not on file  Occupational History   Not on file  Tobacco Use   Smoking status: Never   Smokeless tobacco: Never  Substance and Sexual Activity   Alcohol use: Yes    Comment: occasional    Drug use: Never   Sexual activity: Not on file  Other Topics Concern   Not on file  Social History Narrative   Not on file   Social Determinants of Health   Financial Resource Strain: Not on file  Food Insecurity: Not on file  Transportation Needs: Not on file  Physical Activity: Not on file  Stress: Not on file  Social Connections: Not on file   Vitals:   06/29/20 0846  BP: 110/70  Pulse: 87  Resp: 12  SpO2: 99%  Body mass index is 24.59 kg/m.  Physical Exam Vitals and nursing note reviewed. Exam conducted with a chaperone present.  Constitutional:      General: She is not in acute distress.    Appearance: She is well-developed.  HENT:     Head: Normocephalic and atraumatic.  Eyes:     Conjunctiva/sclera: Conjunctivae normal.  Cardiovascular:     Rate and Rhythm: Normal rate and regular rhythm.     Heart sounds: No murmur heard. Pulmonary:     Effort: Pulmonary effort is normal. No respiratory distress.     Breath sounds: Normal breath sounds.  Chest:  Breasts:    Right: No axillary adenopathy or supraclavicular adenopathy.     Left: No axillary  adenopathy or supraclavicular adenopathy.  Abdominal:     Palpations: Abdomen is soft. There is no mass.     Tenderness: There is no abdominal tenderness.  Genitourinary:    Comments: Breast: Dense beast, nodular lesions around nipple (1.5-2 cm) in right breast, fewer in left breast. Right breast tenderness. No skin lesions or nipple discharge. Lymphadenopathy:     Cervical: No cervical adenopathy.     Upper Body:     Right upper body: No supraclavicular or axillary adenopathy.     Left upper body: No supraclavicular or axillary adenopathy.  Skin:    General: Skin is warm.     Findings: No erythema or rash.     Comments: No suspicious lesions appreciated. Lesion of concerned on upper left-sided, finely scaly and hyperpigmented lesion, defined borders, 6 mm. Similar lesion under right scapulae, more oval shape and smoother.Scattered acne like lesions.  Neurological:     Mental Status: She is alert and oriented to person, place, and time.     Cranial Nerves: No cranial nerve deficit.     Gait: Gait normal.  Psychiatric:        Mood and Affect: Mood is anxious.     Comments: Well groomed, good eye contact.   ASSESSMENT AND PLAN:  Ms. Jordan Santos was seen today for sore heavy breasts and nevus.  Diagnoses and all orders for this visit: Orders Placed This Encounter  Procedures   MM DIAG BREAST TOMO BILATERAL   Breast tenderness in female We discussed possible etiologies including pregnancy. She is s/p BTL and positive she is not pregnant.  Concerned about possibility of malignancy. Because right breast tenderness has been constant for the past 3 weeks, Dx mammogram ordered.  Seborrheic keratosis Reassured, no suspicious lesions on examination today. SK x 2-3 on her back, discussed Dx and prognosis. Continue monitoring skin lesions,applying sun screen, and avoiding direct UV exposure.  Fibrocystic breast disease (FCBD), unspecified laterality Educated about Dx,prognosis,and  treatment options. Handout given.  Return if symptoms worsen or fail to improve.   Jordan Santos G. [place], MD  [facility]. [place] office.

## 2020-07-22 ENCOUNTER — Ambulatory Visit
Admission: RE | Admit: 2020-07-22 | Discharge: 2020-07-22 | Disposition: A | Discharge status: Home / Self Care | Payer: BC Managed Care – PPO | Source: Ambulatory Visit | Attending: Family Medicine | Admitting: Family Medicine

## 2020-07-22 ENCOUNTER — Other Ambulatory Visit: Payer: Self-pay

## 2020-07-22 ENCOUNTER — Other Ambulatory Visit: Payer: Self-pay | Admitting: Family Medicine

## 2020-07-22 DIAGNOSIS — N644 Mastodynia: Secondary | ICD-10-CM

## 2020-07-22 DIAGNOSIS — R922 Inconclusive mammogram: Secondary | ICD-10-CM | POA: Diagnosis not present

## 2020-08-19 ENCOUNTER — Encounter: Payer: BC Managed Care – PPO | Admitting: Internal Medicine

## 2022-04-14 IMAGING — US US BREAST*R* LIMITED INC AXILLA
1 series · 4 of 4 positions shown · non-contrast
Comparison: None.

CLINICAL DATA: Patient presents with bilateral upper outer quadrant
breast pain as well as a lump closer to the nipple right breast.



[Series 1: us breast*right* limited inc axilla · 0.06mm/px · 4 of 4 slices shown]
[im 1/4]
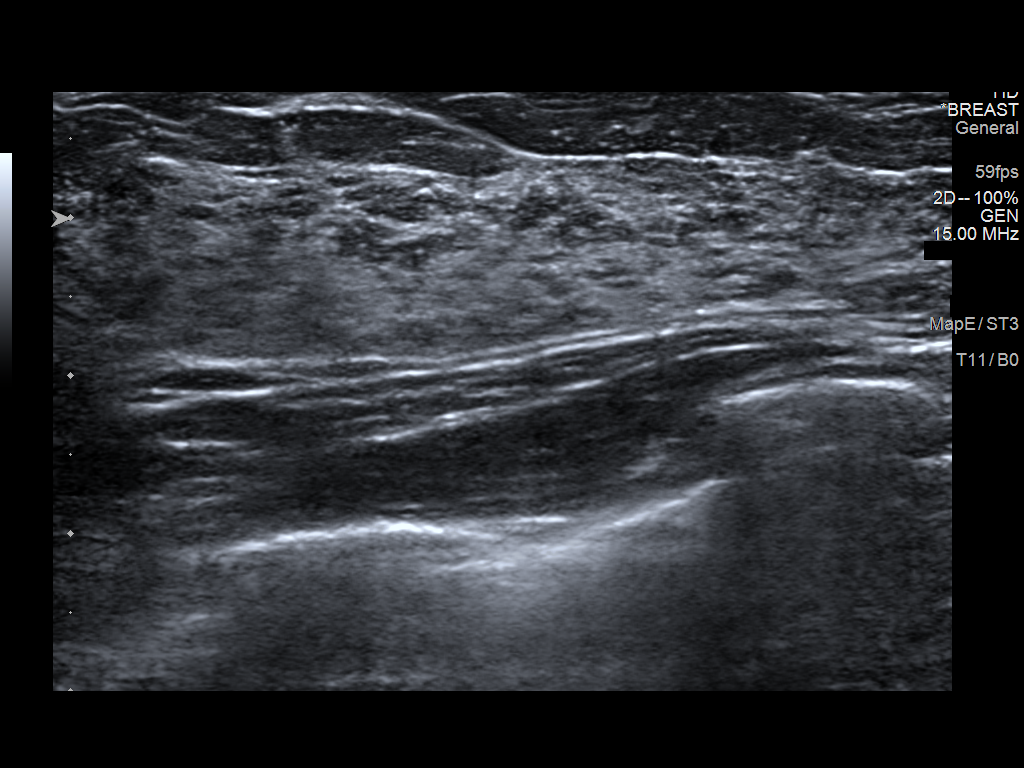
[im 2/4]
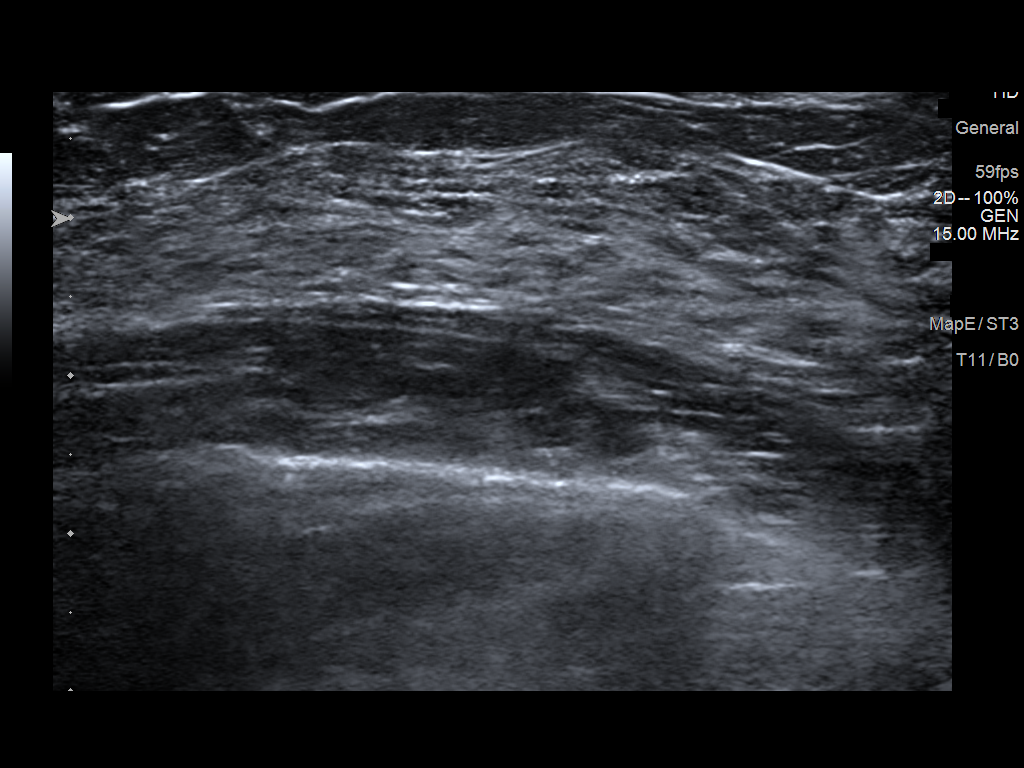
[im 3/4]
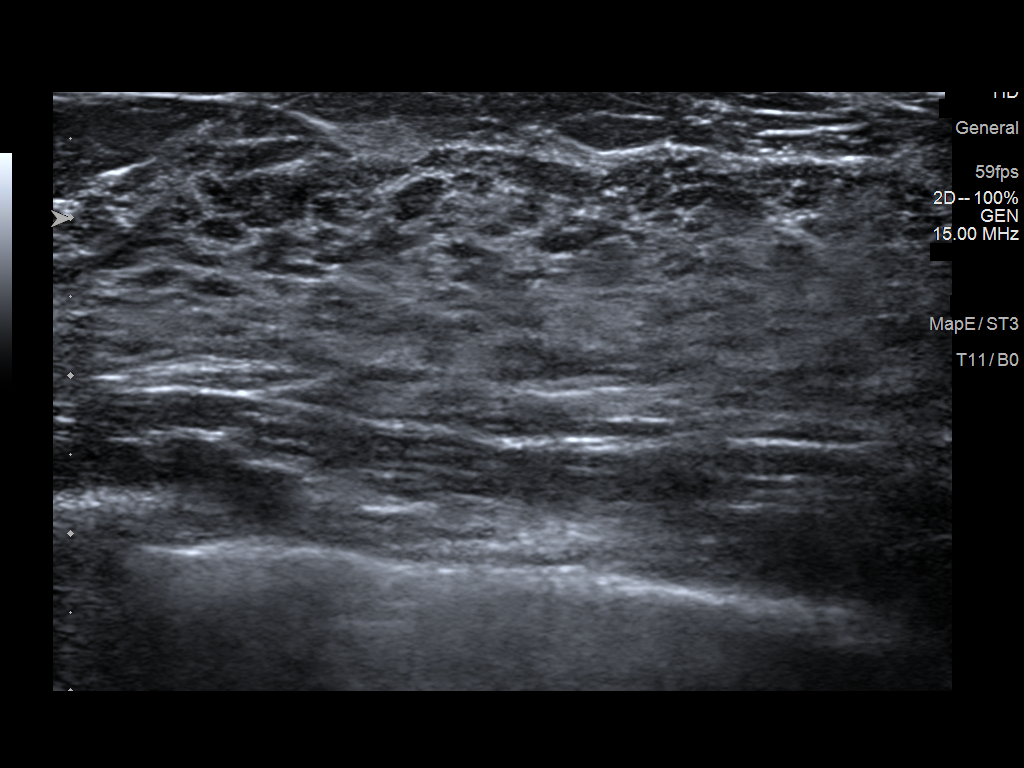
[im 4/4]
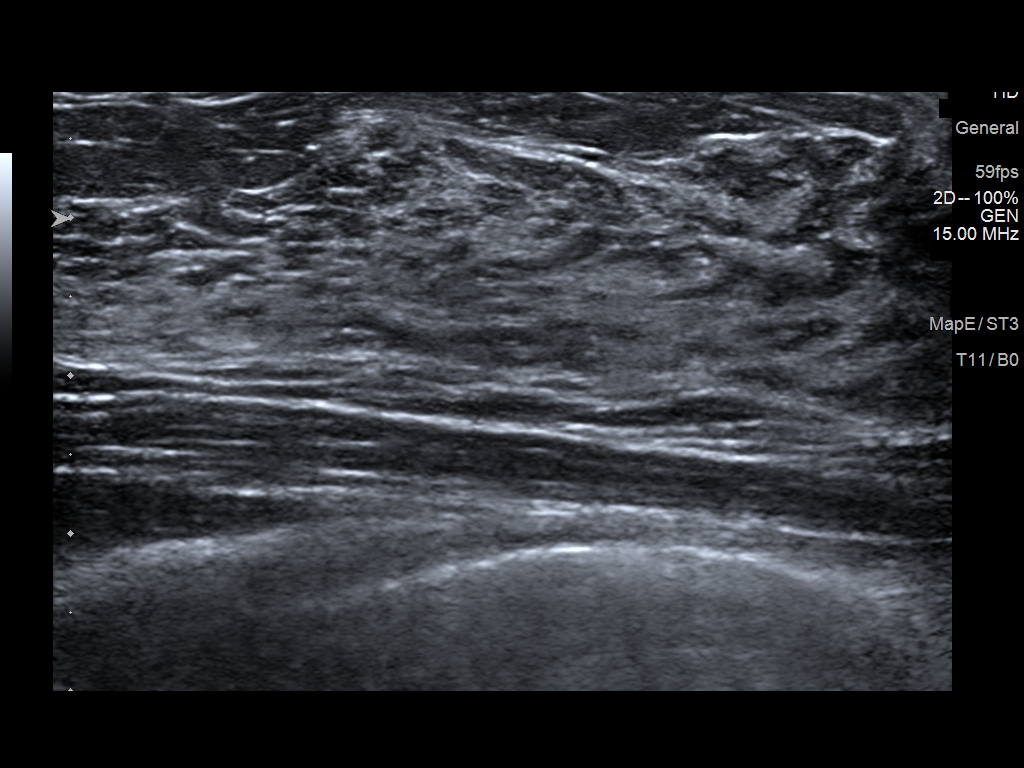

[4 of 4 positions shown; findings below may reference images not displayed]

ACR Breast Density Category c: The breast tissue is heterogeneously
dense, which may obscure small masses.
FINDINGS: There are no masses, areas of architectural distortion, areas of
significant asymmetry or suspicious calcifications.

On physical exam, no mass is palpated in the anterior, upper outer
right breast.

Targeted bilateral breast ultrasound is performed, targeted to the
areas of focal pain in the upper outer quadrants, showing normal
fibroglandular tissue bilaterally. No mass or focal lesion. No
breast cysts.
IMPRESSION: 1. No evidence of breast malignancy.

RECOMMENDATION:
Screening mammogram at age 40 unless there are persistent or
intervening clinical concerns. (Code:I2-D-TOV)

I have discussed the findings and recommendations with the patient.
If applicable, a reminder letter will be sent to the patient
regarding the next appointment.

BI-RADS CATEGORY  1: Negative.

## 2022-04-14 IMAGING — MG DIGITAL DIAGNOSTIC BILAT W/ TOMO W/ CAD
6 of 12 series · 6 of 36 positions shown · non-contrast
Comparison: None.

CLINICAL DATA: Patient presents with bilateral upper outer quadrant
breast pain as well as a lump closer to the nipple right breast.



[L CC synth-2D]
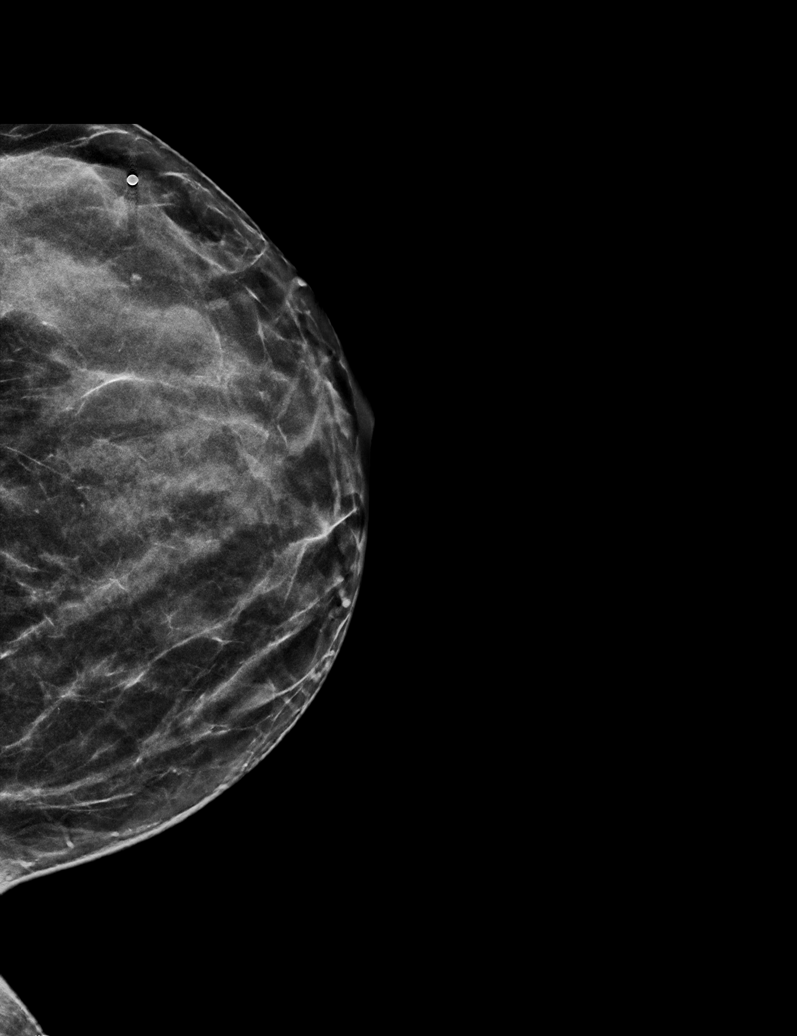

[L MLO synth-2D]
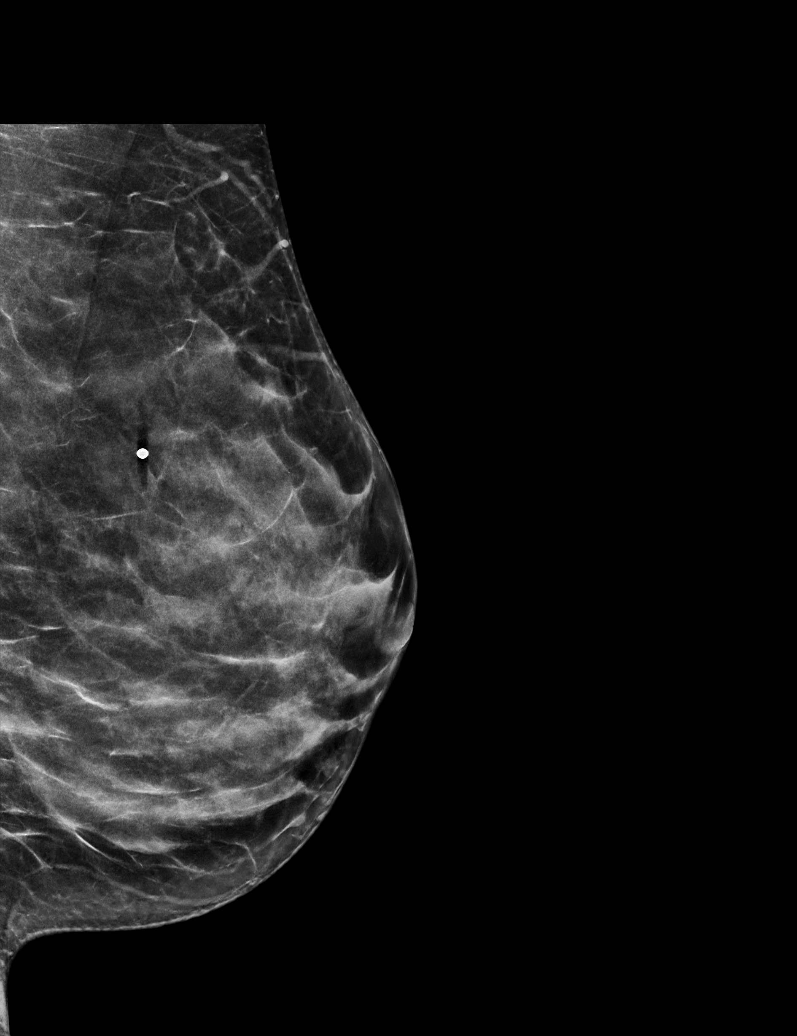

[R MLO synth-2D]
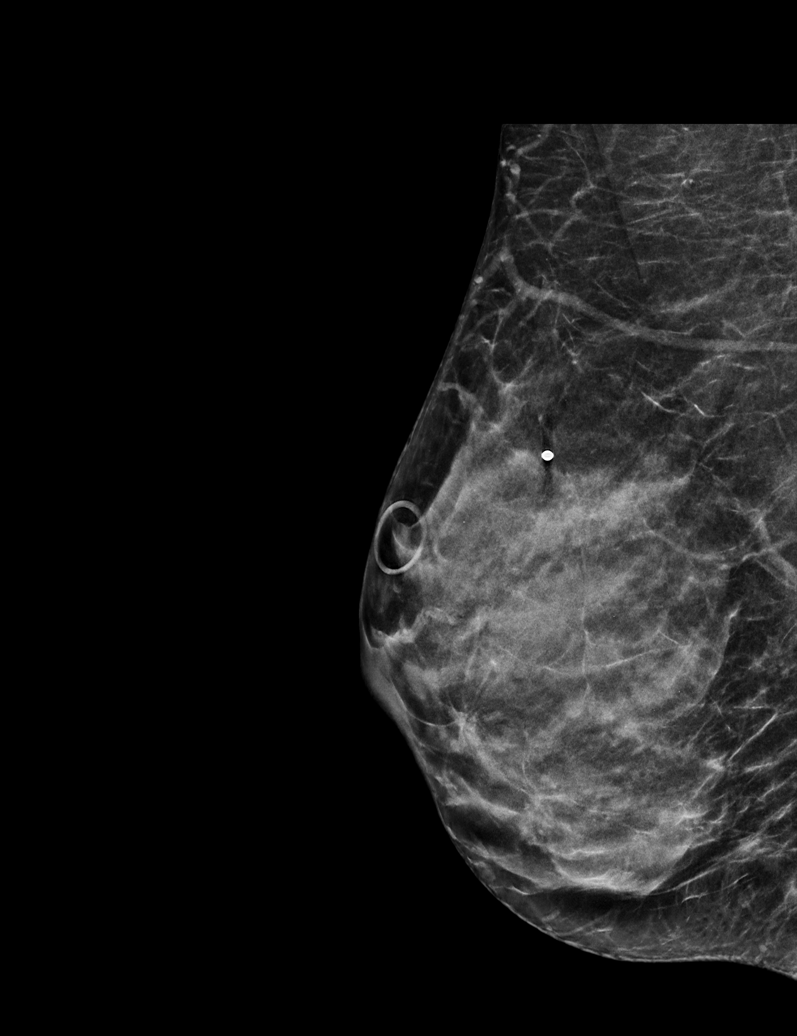

[L TAN synth-2D]
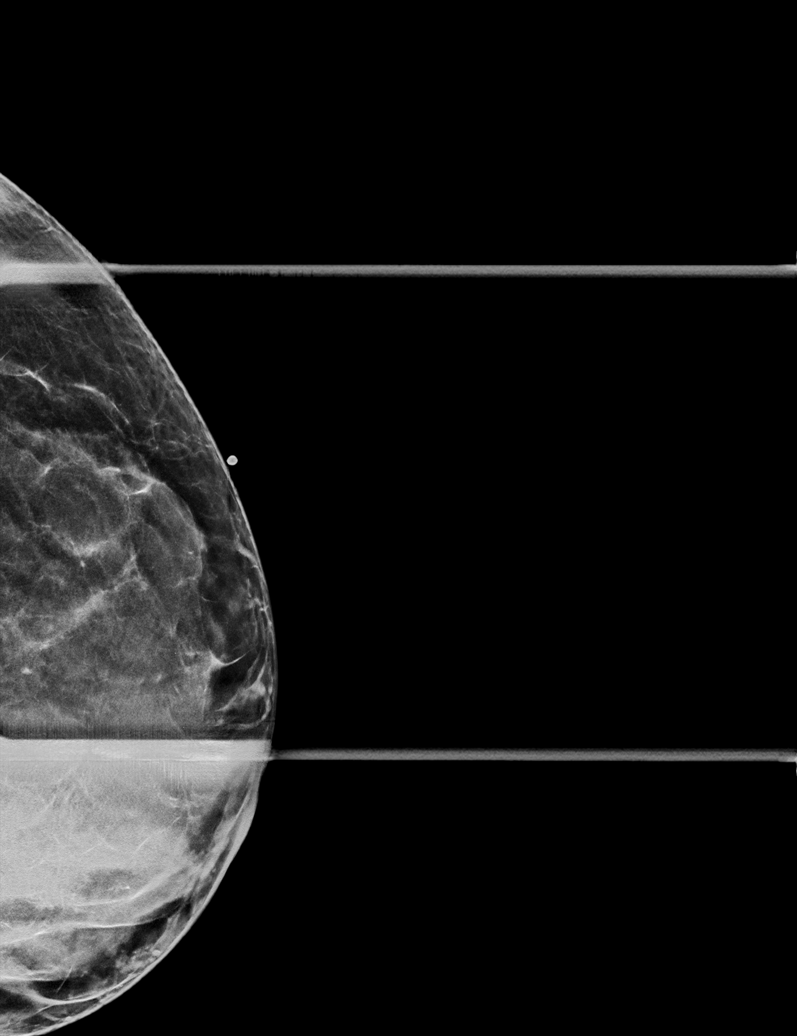

[R CC synth-2D]
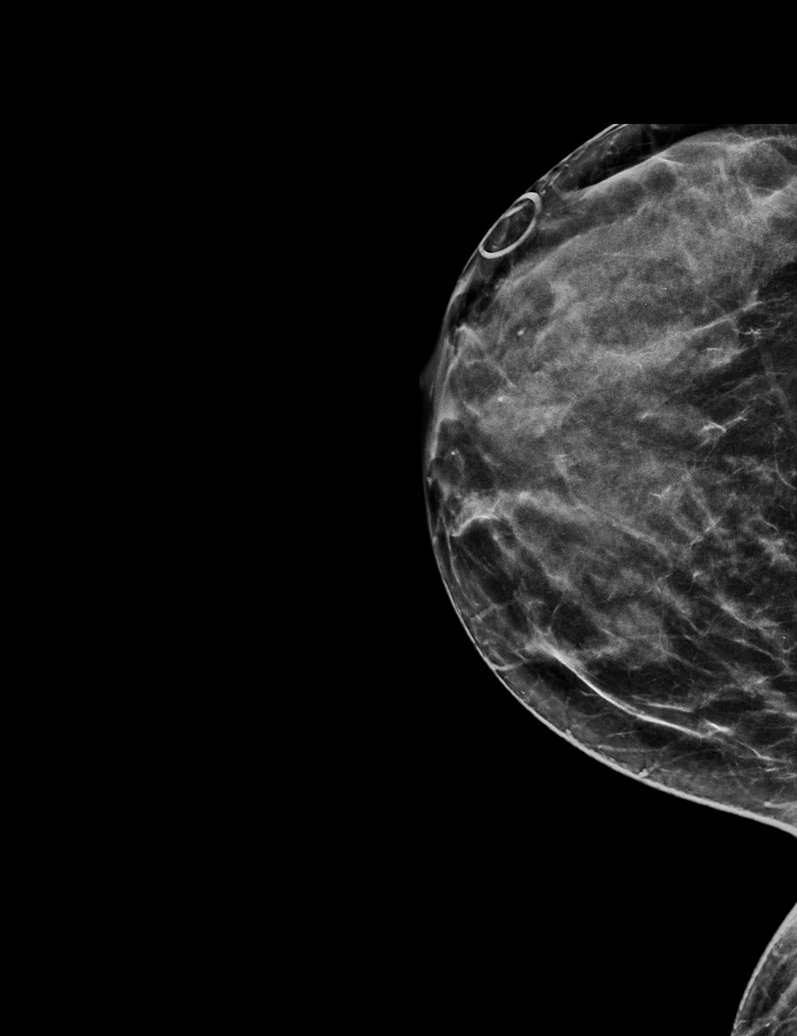

[R TAN synth-2D]
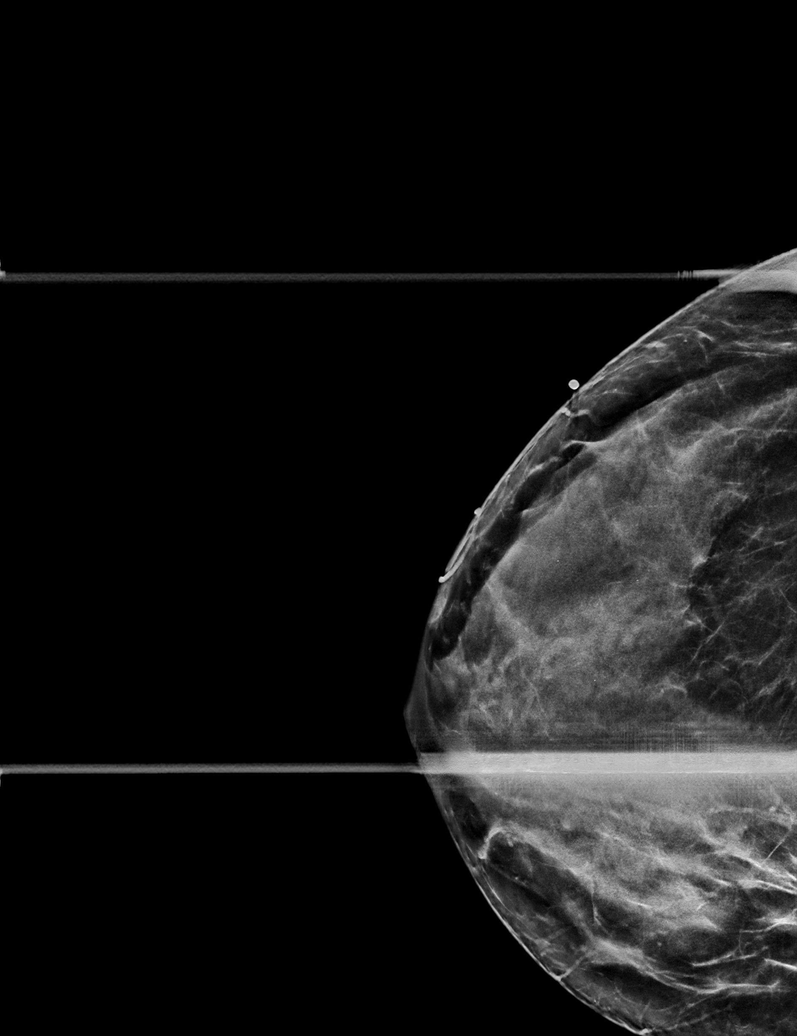

[6 of 36 positions shown; findings below may reference images not displayed]

ACR Breast Density Category c: The breast tissue is heterogeneously
dense, which may obscure small masses.
FINDINGS: There are no masses, areas of architectural distortion, areas of
significant asymmetry or suspicious calcifications.

On physical exam, no mass is palpated in the anterior, upper outer
right breast.

Targeted bilateral breast ultrasound is performed, targeted to the
areas of focal pain in the upper outer quadrants, showing normal
fibroglandular tissue bilaterally. No mass or focal lesion. No
breast cysts.
IMPRESSION: 1. No evidence of breast malignancy.

RECOMMENDATION:
Screening mammogram at age 40 unless there are persistent or
intervening clinical concerns. (Code:I2-D-TOV)

I have discussed the findings and recommendations with the patient.
If applicable, a reminder letter will be sent to the patient
regarding the next appointment.

BI-RADS CATEGORY  1: Negative.
